# Patient Record
Sex: Male | Born: 1960
Health system: Southern US, Community
[De-identification: ages and names within clinical notes are randomized; demographics above are authoritative.]

## PROBLEM LIST (undated history)

## (undated) DIAGNOSIS — K219 Gastro-esophageal reflux disease without esophagitis: Secondary | ICD-10-CM

## (undated) HISTORY — PX: WISDOM TOOTH EXTRACTION: SHX21

## (undated) HISTORY — PX: TYMPANOSTOMY TUBE PLACEMENT: SHX32

## (undated) HISTORY — PX: TONSILLECTOMY: SUR1361

---

## 2018-05-17 DIAGNOSIS — Z0000 Encounter for general adult medical examination without abnormal findings: Secondary | ICD-10-CM | POA: Diagnosis not present

## 2018-05-21 DIAGNOSIS — Z0000 Encounter for general adult medical examination without abnormal findings: Secondary | ICD-10-CM | POA: Diagnosis not present

## 2018-06-21 DIAGNOSIS — D72820 Lymphocytosis (symptomatic): Secondary | ICD-10-CM | POA: Diagnosis not present

## 2018-07-08 DIAGNOSIS — D72820 Lymphocytosis (symptomatic): Secondary | ICD-10-CM | POA: Diagnosis not present

## 2018-07-08 DIAGNOSIS — R7989 Other specified abnormal findings of blood chemistry: Secondary | ICD-10-CM | POA: Diagnosis not present

## 2018-08-23 DIAGNOSIS — M25569 Pain in unspecified knee: Secondary | ICD-10-CM | POA: Diagnosis not present

## 2019-01-19 ENCOUNTER — Emergency Department (HOSPITAL_COMMUNITY)
Admission: EM | Admit: 2019-01-19 | Discharge: 2019-01-19 | Disposition: A | Discharge status: Home / Self Care | Payer: 59 | Attending: Emergency Medicine | Admitting: Emergency Medicine

## 2019-01-19 ENCOUNTER — Encounter (HOSPITAL_COMMUNITY): Payer: Self-pay | Admitting: Emergency Medicine

## 2019-01-19 ENCOUNTER — Emergency Department (HOSPITAL_COMMUNITY): Payer: 59

## 2019-01-19 ENCOUNTER — Other Ambulatory Visit: Payer: Self-pay

## 2019-01-19 DIAGNOSIS — K8020 Calculus of gallbladder without cholecystitis without obstruction: Secondary | ICD-10-CM | POA: Diagnosis not present

## 2019-01-19 DIAGNOSIS — R7989 Other specified abnormal findings of blood chemistry: Secondary | ICD-10-CM

## 2019-01-19 DIAGNOSIS — R112 Nausea with vomiting, unspecified: Secondary | ICD-10-CM | POA: Insufficient documentation

## 2019-01-19 DIAGNOSIS — R1011 Right upper quadrant pain: Secondary | ICD-10-CM | POA: Insufficient documentation

## 2019-01-19 DIAGNOSIS — F17210 Nicotine dependence, cigarettes, uncomplicated: Secondary | ICD-10-CM | POA: Diagnosis not present

## 2019-01-19 DIAGNOSIS — R61 Generalized hyperhidrosis: Secondary | ICD-10-CM | POA: Insufficient documentation

## 2019-01-19 DIAGNOSIS — R1013 Epigastric pain: Secondary | ICD-10-CM | POA: Diagnosis present

## 2019-01-19 LAB — COMPREHENSIVE METABOLIC PANEL
ALT: 47 U/L — ABNORMAL HIGH (ref 0–44)
AST: 30 U/L (ref 15–41)
Albumin: 4.2 g/dL (ref 3.5–5.0)
Alkaline Phosphatase: 60 U/L (ref 38–126)
Anion gap: 11 (ref 5–15)
BUN: 14 mg/dL (ref 6–20)
CO2: 22 mmol/L (ref 22–32)
Calcium: 9.2 mg/dL (ref 8.9–10.3)
Chloride: 100 mmol/L (ref 98–111)
Creatinine, Ser: 1.44 mg/dL — ABNORMAL HIGH (ref 0.61–1.24)
GFR calc Af Amer: >60 mL/min (ref >60)
GFR calc non Af Amer: 54 mL/min — ABNORMAL LOW (ref >60)
Glucose, Bld: 132 mg/dL — ABNORMAL HIGH (ref 70–99)
Potassium: 3.6 mmol/L (ref 3.5–5.1)
Sodium: 133 mmol/L — ABNORMAL LOW (ref 135–145)
Total Bilirubin: 0.6 mg/dL (ref 0.3–1.2)
Total Protein: 6.9 g/dL (ref 6.5–8.1)

## 2019-01-19 LAB — CBC WITH DIFFERENTIAL/PLATELET
Abs Immature Granulocytes: 0.03 10*3/uL (ref 0.00–0.07)
Basophils Absolute: 0.1 10*3/uL (ref 0.0–0.1)
Basophils Relative: 1 %
Eosinophils Absolute: 0.2 10*3/uL (ref 0.0–0.5)
Eosinophils Relative: 2 %
HCT: 47.4 % (ref 39.0–52.0)
Hemoglobin: 16.8 g/dL (ref 13.0–17.0)
Immature Granulocytes: 0 %
Lymphocytes Relative: 23 %
Lymphs Abs: 2.7 10*3/uL (ref 0.7–4.0)
MCH: 32.4 pg (ref 26.0–34.0)
MCHC: 35.4 g/dL (ref 30.0–36.0)
MCV: 91.5 fL (ref 80.0–100.0)
Monocytes Absolute: 0.7 10*3/uL (ref 0.1–1.0)
Monocytes Relative: 6 %
Neutro Abs: 8 10*3/uL — ABNORMAL HIGH (ref 1.7–7.7)
Neutrophils Relative %: 68 %
Platelets: 228 10*3/uL (ref 150–400)
RBC: 5.18 MIL/uL (ref 4.22–5.81)
RDW: 12.6 % (ref 11.5–15.5)
WBC: 11.7 10*3/uL — ABNORMAL HIGH (ref 4.0–10.5)
nRBC: 0 % (ref 0.0–0.2)

## 2019-01-19 LAB — TROPONIN I: Troponin I: <0.03 ng/mL (ref <0.03)

## 2019-01-19 LAB — LIPASE, BLOOD: Lipase: 47 U/L (ref 11–51)

## 2019-01-19 MED ORDER — FAMOTIDINE IN NACL 20-0.9 MG/50ML-% IV SOLN
20.0000 mg | Freq: Once | INTRAVENOUS | Status: AC
  Administered 2019-01-19: 20 mg via INTRAVENOUS
  Filled 2019-01-19: qty 50

## 2019-01-19 MED ORDER — MORPHINE SULFATE (PF) 4 MG/ML IV SOLN
4.0000 mg | Freq: Once | INTRAVENOUS | Status: AC
  Administered 2019-01-19: 19:00:00 4 mg via INTRAVENOUS
  Filled 2019-01-19: qty 1

## 2019-01-19 MED ORDER — FAMOTIDINE 20 MG PO TABS: 20.0000 mg | ORAL_TABLET | Freq: Two times a day (BID) | ORAL | 0 refills | Status: AC

## 2019-01-19 MED ORDER — HYDROCODONE-ACETAMINOPHEN 5-325 MG PO TABS: 1.0000 | ORAL_TABLET | ORAL | 0 refills | Status: AC | PRN

## 2019-01-19 MED ORDER — OXYCODONE-ACETAMINOPHEN 5-325 MG PO TABS
2.0000 | ORAL_TABLET | Freq: Once | ORAL | Status: AC
  Administered 2019-01-19: 2 via ORAL
  Filled 2019-01-19: qty 2

## 2019-01-19 MED ORDER — ONDANSETRON HCL 4 MG/2ML IJ SOLN
4.0000 mg | Freq: Once | INTRAMUSCULAR | Status: AC
  Administered 2019-01-19: 19:00:00 4 mg via INTRAVENOUS
  Filled 2019-01-19: qty 2

## 2019-01-19 NOTE — ED Provider Notes (Signed)
[facility] Provider Note   CSN: [id number] Arrival date & time: 01/19/19  1836    History   Chief Complaint Chief Complaint  Patient presents with  . Abdominal Pain    HPI Carlos RobinsonDavid Cullop is a 58 y.o. male.     Patient is a 58 year old male who presents with epigastric pain.  He states that started around 3-3 30 this afternoon.  He describes as indigestion pain in his epigastrium that radiates around both sides of his upper abdomen and around to his back.  It slightly more on the right side.  He had some nausea and vomiting associated with this.  He got a little diaphoretic.  No shortness of breath.  He has no family history of early heart disease.  No history of hypertension, hyperlipidemia or diabetes.  He does smoke cigarettes.  He denies any known fevers.  His emesis was nonbloody and nonbilious.     History reviewed. No pertinent past medical history.  There are no active problems to display for this patient.   Past Surgical History:  Procedure Laterality Date  . TONSILLECTOMY          Home Medications    Prior to Admission medications   Medication Sig Start Date End Date Taking? Authorizing Provider  famotidine (PEPCID) 20 MG tablet Take 1 tablet (20 mg total) by mouth 2 (two) times daily. 01/19/19   Rolan BuccoBelfi, Cicily Bonano, MD  HYDROcodone-acetaminophen (NORCO/VICODIN) 5-325 MG tablet Take 1-2 tablets by mouth every 4 (four) hours as needed. 01/19/19   Rolan BuccoBelfi, Daniele Yankowski, MD    Family History No family history on file.  Social History Social History   Tobacco Use  . Smoking status: Current Every Day Smoker    Packs/day: 1.00    Types: Cigarettes  . Smokeless tobacco: Never Used  Substance Use Topics  . Alcohol use: Not Currently  . Drug use: Never     Allergies   Patient has no known allergies.   Review of Systems Review of Systems  Constitutional: Negative for chills, diaphoresis, fatigue and fever.  HENT: Negative  for congestion, rhinorrhea and sneezing.   Eyes: Negative.   Respiratory: Negative for cough, chest tightness and shortness of breath.   Cardiovascular: Negative for chest pain and leg swelling.  Gastrointestinal: Positive for abdominal pain, nausea and vomiting. Negative for blood in stool and diarrhea.  Genitourinary: Negative for difficulty urinating, flank pain, frequency and hematuria.  Musculoskeletal: Positive for back pain. Negative for arthralgias.  Skin: Negative for rash.  Neurological: Negative for dizziness, speech difficulty, weakness, numbness and headaches.     Physical Exam Updated Vital Signs BP (!) 154/100   Pulse 70   Temp 97.8 F (36.6 C) (Oral)   Resp 14   SpO2 99%   Physical Exam Constitutional:      Appearance: He is well-developed.  HENT:     Head: Normocephalic and atraumatic.  Eyes:     Pupils: Pupils are equal, round, and reactive to light.  Neck:     Musculoskeletal: Normal range of motion and neck supple.  Cardiovascular:     Rate and Rhythm: Normal rate and regular rhythm.     Heart sounds: Normal heart sounds.  Pulmonary:     Effort: Pulmonary effort is normal. No respiratory distress.     Breath sounds: Normal breath sounds. No wheezing or rales.  Chest:     Chest wall: No tenderness.  Abdominal:     General: Bowel sounds are normal.  Palpations: Abdomen is soft.     Tenderness: There is abdominal tenderness in the right upper quadrant. There is no guarding or rebound.  Musculoskeletal: Normal range of motion.  Lymphadenopathy:     Cervical: No cervical adenopathy.  Skin:    General: Skin is warm and dry.     Findings: No rash.  Neurological:     Mental Status: He is alert and oriented to person, place, and time.      ED Treatments / Results  Labs (all labs ordered are listed, but only abnormal results are displayed) Labs Reviewed  COMPREHENSIVE METABOLIC PANEL - Abnormal; Notable for the following components:      Result  Value   Sodium 133 (*)    Glucose, Bld 132 (*)    Creatinine, Ser 1.44 (*)    ALT 47 (*)    GFR calc non Af Amer 54 (*)    All other components within normal limits  CBC WITH DIFFERENTIAL/PLATELET - Abnormal; Notable for the following components:   WBC 11.7 (*)    Neutro Abs 8.0 (*)    All other components within normal limits  LIPASE, BLOOD  TROPONIN I    EKG EKG Interpretation  Date/Time:  Saturday Jan 19 2019 18:44:10 EDT Ventricular Rate:  69 PR Interval:    QRS Duration: 107 QT Interval:  382 QTC Calculation: 410 R Axis:   44 Text Interpretation:  Sinus rhythm Borderline prolonged PR interval Low voltage, precordial leads RSR' in V1 or V2, right VCD or RVH No old tracing to compare Confirmed by Rolan Bucco [phone]) on 01/19/2019 6:46:17 PM   Radiology US Abdomen Limited Ruq  Result Date: 01/19/2019 CLINICAL DATA:  Right upper quadrant abdominal pain EXAM: ULTRASOUND ABDOMEN LIMITED RIGHT UPPER QUADRANT COMPARISON:  None. FINDINGS: Gallbladder: Multiple gallstones are noted without gallbladder wall thickening or pericholecystic free fluid. The sonographic Eulah Pont sign is reported as negative. The gallbladder is moderately distended. Common bile duct: Diameter: 0.3 cm Liver: No focal lesion identified. Within normal limits in parenchymal echogenicity. Portal vein is patent on color Doppler imaging with normal direction of blood flow towards the liver. IMPRESSION: Cholelithiasis without sonographic evidence for acute cholecystitis. The gallbladder is somewhat distended. Electronically Signed   By: Katherine Mantle M.D.   On: 01/19/2019 19:49    Procedures Procedures (including critical care time)  Medications Ordered in ED Medications  oxyCODONE-acetaminophen (PERCOCET/ROXICET) 5-325 MG per tablet 2 tablet (has no administration in time range)  morphine 4 MG/ML injection 4 mg (4 mg Intravenous Given 01/19/19 1914)  ondansetron (ZOFRAN) injection 4 mg (4 mg Intravenous  Given 01/19/19 1913)  famotidine (PEPCID) IVPB 20 mg premix (0 mg Intravenous Stopped 01/19/19 2029)     Initial Impression / Assessment and Plan / ED Course  I have reviewed the triage vital signs and the nursing notes.  Pertinent labs & imaging results that were available during my care of the patient were reviewed by me and considered in my medical decision making (see chart for details).        Patient is a 58 year old male who presents with epigastric and right upper quadrant pain.  Ultrasound shows cholelithiasis but no evidence of cholecystitis.  His labs show a mildly elevated white count which he states he has had several times in the past.  He is afebrile.  His creatinine is mildly elevated but the last values we have are from 2012.  His ALT is minimally elevated but his other LFTs are within normal limits.  His pain is controlled in the ED.  His troponin is negative.  He has no EKG changes.  His symptoms sound more consistent with cholelithiasis and not a suspicious for ACS.  He was discharged home in good condition.  He was advised on a low-fat diet.  He was advised to make a follow-up appointment with [facility].  He was advised that he needs to follow-up with his primary care physician regarding his elevated creatinine.  He also was notified that his glucose was mildly elevated.  He was given precautions to return if he has worsening symptoms including worsening abdominal pain, fevers or vomiting.  He was given a small prescription for Vicodin and Pepcid.  Final Clinical Impressions(s) / ED Diagnoses   Final diagnoses:  RUQ pain  Calculus of gallbladder without cholecystitis without obstruction  Elevated serum creatinine    ED Discharge Orders         Ordered    HYDROcodone-acetaminophen (NORCO/VICODIN) 5-325 MG tablet  Every 4 hours PRN     01/19/19 2053    famotidine (PEPCID) 20 MG tablet  2 times daily     01/19/19 2053           Rolan Bucco, MD  01/19/19 2059

## 2019-01-19 NOTE — ED Notes (Signed)
ED Provider at bedside. 

## 2019-01-19 NOTE — ED Notes (Signed)
Wife, called and asked to be informed of any changes with the pt Dolson, carolyn Spouse   [phone]

## 2019-01-19 NOTE — ED Notes (Signed)
Patient educated about not driving or performing other critical tasks (such as operating heavy machinery, caring for infant/toddler/child) due to sedative nature of narcotic medications received while in the ED.  Pt/caregiver verbalized understanding.   

## 2019-01-19 NOTE — Discharge Instructions (Addendum)
Your creatinine was mildly elevated.  This needs to be followed up by your primary care doctor.  You have a gallstone in your gallbladder.  At this point there is no signs of infection of the gallbladder.  You need to make an appointment to follow-up with the surgeon regarding this.  Return to the emergency room if you have any worsening pain, vomiting, fevers or other worsening symptoms.

## 2019-01-19 NOTE — ED Notes (Signed)
Pt updating wife with phone in room.

## 2019-01-19 NOTE — ED Triage Notes (Addendum)
Patient arrives POV c/o epigastric pain onst of 1530 today. Patient states pain started mid upper abdomen but has now radiated around his ribs into his back. Patient also having nausea. Denies any shortness of breath.

## 2019-01-23 ENCOUNTER — Other Ambulatory Visit: Payer: Self-pay | Admitting: General Surgery

## 2019-01-23 DIAGNOSIS — K8020 Calculus of gallbladder without cholecystitis without obstruction: Secondary | ICD-10-CM | POA: Diagnosis not present

## 2019-01-23 DIAGNOSIS — F17200 Nicotine dependence, unspecified, uncomplicated: Secondary | ICD-10-CM | POA: Diagnosis not present

## 2019-01-25 ENCOUNTER — Other Ambulatory Visit: Payer: Self-pay

## 2019-01-25 ENCOUNTER — Encounter (HOSPITAL_COMMUNITY): Payer: Self-pay | Admitting: *Deleted

## 2019-01-25 ENCOUNTER — Other Ambulatory Visit (HOSPITAL_COMMUNITY)
Admission: RE | Admit: 2019-01-25 | Discharge: 2019-01-25 | Disposition: A | Discharge status: Home / Self Care | Payer: 59 | Source: Ambulatory Visit | Attending: General Surgery | Admitting: General Surgery

## 2019-01-25 DIAGNOSIS — Z1159 Encounter for screening for other viral diseases: Secondary | ICD-10-CM | POA: Insufficient documentation

## 2019-01-25 NOTE — Progress Notes (Signed)
Carlos Soto denies chest pain or shortness. Carlos Soto denies that she or her family has experienced any of the following: Cough Fever >100.4 Runny Nose Sore Throat Difficulty breathing/ shortness of breath Travel in past 14 days - no Carlos Soto had COVID test today and he is quarantine with his wife.

## 2019-01-26 LAB — NOVEL CORONAVIRUS, NAA (HOSP ORDER, SEND-OUT TO REF LAB; TAT 18-24 HRS): SARS-CoV-2, NAA: NOT DETECTED

## 2019-01-27 NOTE — H&P (Signed)
Carlos Soto Location: [facility] Surgery Patient #: [id number] DOB: December 01, 1960 Married / Language: English / Race: White Male       History of Present Illness       This is a very pleasant 58 year old man, referred by Rolan Bucco, ED physician for evaluation of symptomatic gallstones. his primary care physician is in [place] and he cannot remember the name.     He may have had some mild nonspecific GI symptoms over the past year. A little reflux and indigestion. A little bloating but nothing dramatic.    on Jan 19, 2019 he developed epigastric pain as he was coming home from work. After about 2 hours it had become more severe right upper quadrant greater than  left upper quadrant and back and was associated with nausea and vomiting. He went to the emergency department and received narcotic analgesics and that helped. Ultrasound showed numerous gallstones but no inflammatory changes. CBD 0.3 cm. ALT 47. Creatinine 1.44. Lipase normal. WBC 11,700. Hemoglobin 6.8. The ED physician said he was tender in the right upper quadrant. has felt better since then. Maybe a little discomfort. No change in his bowel habits. Tends to be a little constipated.      Past history significant for daily cigarette use. Tonsillectomy. Sebaceous cyst removal. Essentially healthy Family history mother living with hypertension. Father living hypertension and peripheral arterial disease and cholecystectomy next social history married. Lives in [place]. 2 children. Smokes tobacco daily. Denies alcohol. 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      He was in no acute distress today but is concerned about recurrent attacks. I offered to proceed with cholecystectomy and he agrees. I advised him to stay well-hydrated and gave him specific instructions because of his creatinine I told him to adhere to a very low-fat diet to try to avoid attacks       He will be scheduled for laparoscopic cholecystectomy with cholangiogram. I discussed the indications, details, techniques, and numerous risk of the surgery with him. He is aware the risk of bleeding, infection, injury to adjacent organs with major reconstructive surgery, conversion to open laparotomy, pancreatitis, bile leak, cardiac pulmonary thromboembolic problems. He understands these issues well. All his questions were answered. He agrees with this plan.      Past Surgical History  Tonsillectomy   Diagnostic Studies History  Colonoscopy  never  Allergies  No Known Drug Allergies  Allergies Reconciled   Medication History Famotidine (20MG  Tablet, Oral) Active. HYDROcodone-Acetaminophen (5-325MG  Tablet, Oral) Active. Tylenol (325MG  Tablet, Oral) Active. Sudafed 12 Hour (Oral) Specific strength unknown - Active. Medications Reconciled  Social History Alcohol use  Remotely quit alcohol use. Caffeine use  Carbonated beverages, Coffee, Tea. No drug use  Tobacco use  Current every day smoker.  Family History  Alcohol Abuse  Father. Diabetes Mellitus  Mother. Hypertension  Father, Mother. Migraine Headache  Son.  Other Problems  Back Pain     Review of Systems  General Present- Appetite Loss. Not Present- Chills, Fatigue, Fever, Night Sweats, Weight Gain and Weight Loss. Skin Not Present- Change in Wart/Mole, Dryness, Hives, Jaundice, New Lesions, Non-Healing Wounds, Rash and Ulcer. HEENT Present- Wears glasses/contact lenses. Not Present- Earache, Hearing Loss, Hoarseness, Nose Bleed, Oral Ulcers, Ringing in the Ears, Seasonal Allergies, Sinus Pain, Sore Throat, Visual Disturbances and Yellow Eyes. Respiratory Not Present- Bloody sputum, Chronic Cough, Difficulty Breathing, Snoring and Wheezing. Cardiovascular Not Present- Chest Pain,  Difficulty Breathing Lying Down, Leg Cramps, Palpitations, Rapid Heart Rate, Shortness of Breath and Swelling of  Extremities. Gastrointestinal Present- Bloating and Gets full quickly at meals. Not Present- Abdominal Pain, Bloody Stool, Change in Bowel Habits, Chronic diarrhea, Constipation, Difficulty Swallowing, Excessive gas, Hemorrhoids, Indigestion, Nausea, Rectal Pain and Vomiting. Male Genitourinary Present- Frequency and Urine Leakage. Not Present- Blood in Urine, Change in Urinary Stream, Impotence, Nocturia, Painful Urination and Urgency. Musculoskeletal Not Present- Back Pain, Joint Pain, Joint Stiffness, Muscle Pain, Muscle Weakness and Swelling of Extremities. Neurological Not Present- Decreased Memory, Fainting, Headaches, Numbness, Seizures, Tingling, Tremor, Trouble walking and Weakness. Psychiatric Not Present- Anxiety, Bipolar, Change in Sleep Pattern, Depression, Fearful and Frequent crying. Endocrine Not Present- Cold Intolerance, Excessive Hunger, Hair Changes, Heat Intolerance, Hot flashes and New Diabetes. Hematology Not Present- Blood Thinners, Easy Bruising, Excessive bleeding, Gland problems, HIV and Persistent Infections.  Vitals  Weight: 167.2 lb Height: 65in Body Surface Area: 1.83 m Body Mass Index: 27.82 kg/m  Temp.: 56F(Temporal)  Pulse: 89 (Regular)  P.OX: 98% (Room air) BP: 128/88 (Sitting, Left Arm, Standard)    Physical Exam  General Mental Status-Alert. General Appearance-Consistent with stated age. Hydration-Well hydrated. Voice-Normal.  Head and Neck Head-normocephalic, atraumatic with no lesions or palpable masses. Trachea-midline.  Eye Eyeball - Bilateral-Extraocular movements intact. Sclera/Conjunctiva - Bilateral-No scleral icterus.  Chest and Lung Exam Chest and lung exam reveals -quiet, even and easy respiratory effort with no use of accessory muscles and on auscultation, normal breath sounds, no adventitious sounds and normal vocal resonance. Inspection Chest Wall - Normal. Back -  normal.  Cardiovascular Cardiovascular examination reveals -normal heart sounds, regular rate and rhythm with no murmurs and normal pedal pulses bilaterally.  Abdomen Inspection Inspection of the abdomen reveals - No Hernias. Skin - Scar - no surgical scars. Palpation/Percussion Palpation and Percussion of the abdomen reveal - Soft, Non Tender, No Rebound tenderness, No Rigidity (guarding) and No hepatosplenomegaly. Auscultation Auscultation of the abdomen reveals - Bowel sounds normal. Note: Abdomen is soft. Nondistended. Nontender. No palpable mass of the right upper quadrant.   Neurologic Neurologic evaluation reveals -alert and oriented x 3 with no impairment of recent or remote memory. Mental Status-Normal.  Musculoskeletal Normal Exam - Left-Upper Extremity Strength Normal and Lower Extremity Strength Normal. Normal Exam - Right-Upper Extremity Strength Normal, Lower Extremity Weakness.    Assessment & Plan  GALLSTONES (K80.20)  You were recently evaluated in the emergency department for moderately severe upper abdominal pain and vomiting. your gallbladder ultrasound showed multiple gallstones One of your liver function tests were slightly elevated One of your kidney test was slightly elevated, possibly due to dehydration  Things have settled down and you are not having much pain now I cannot predict when you will have another attack, nor can I predict when you'll have a complication I have offered to schedule you for gallbladder surgery and you have agreed to this  In the meantime, I recommend that you drink 6 or 7 glasses of water or juice a day to rehydrate In the meantime, I recommend a very strict low-fat diet  you will be scheduled for laparoscopic cholecystectomy with possible cholangiogram in the near future I discussed the indications, techniques, and risk of the surgery with you in detail  SMOKES TOBACCO DAILY (F17.200) Impression: I encouraged  smoking cessation    Carlos Soto M. Derrell LollingIngram, M.D., [facility] WorthFACS [facility] General and Minimally invasive Surgery Breast and Colorectal Surgery Office:   [phone] Pager:   [phone]

## 2019-01-28 ENCOUNTER — Ambulatory Visit (HOSPITAL_COMMUNITY)
Admission: RE | Admit: 2019-01-28 | Discharge: 2019-01-28 | Disposition: A | Discharge status: Home / Self Care | Payer: 59 | Attending: General Surgery | Admitting: General Surgery

## 2019-01-28 ENCOUNTER — Other Ambulatory Visit: Payer: Self-pay

## 2019-01-28 ENCOUNTER — Encounter (HOSPITAL_COMMUNITY): Payer: Self-pay | Admitting: *Deleted

## 2019-01-28 ENCOUNTER — Encounter (HOSPITAL_COMMUNITY)
Admission: RE | Disposition: A | Discharge status: Home / Self Care | Payer: Self-pay | Source: Home / Self Care | Attending: General Surgery

## 2019-01-28 ENCOUNTER — Ambulatory Visit (HOSPITAL_COMMUNITY): Payer: 59 | Admitting: Certified Registered Nurse Anesthetist

## 2019-01-28 DIAGNOSIS — Z79899 Other long term (current) drug therapy: Secondary | ICD-10-CM | POA: Diagnosis not present

## 2019-01-28 DIAGNOSIS — K219 Gastro-esophageal reflux disease without esophagitis: Secondary | ICD-10-CM | POA: Diagnosis not present

## 2019-01-28 DIAGNOSIS — F17210 Nicotine dependence, cigarettes, uncomplicated: Secondary | ICD-10-CM | POA: Insufficient documentation

## 2019-01-28 DIAGNOSIS — K5900 Constipation, unspecified: Secondary | ICD-10-CM | POA: Insufficient documentation

## 2019-01-28 DIAGNOSIS — K8012 Calculus of gallbladder with acute and chronic cholecystitis without obstruction: Secondary | ICD-10-CM | POA: Insufficient documentation

## 2019-01-28 HISTORY — PX: CHOLECYSTECTOMY: SHX55

## 2019-01-28 HISTORY — DX: Gastro-esophageal reflux disease without esophagitis: K21.9

## 2019-01-28 LAB — COMPREHENSIVE METABOLIC PANEL
ALT: 44 U/L (ref 0–44)
AST: 42 U/L — ABNORMAL HIGH (ref 15–41)
Albumin: 3.5 g/dL (ref 3.5–5.0)
Alkaline Phosphatase: 66 U/L (ref 38–126)
Anion gap: 11 (ref 5–15)
BUN: 17 mg/dL (ref 6–20)
CO2: 19 mmol/L — ABNORMAL LOW (ref 22–32)
Calcium: 8.9 mg/dL (ref 8.9–10.3)
Chloride: 106 mmol/L (ref 98–111)
Creatinine, Ser: 1.17 mg/dL (ref 0.61–1.24)
GFR calc Af Amer: >60 mL/min (ref >60)
GFR calc non Af Amer: >60 mL/min (ref >60)
Glucose, Bld: 89 mg/dL (ref 70–99)
Potassium: 5 mmol/L (ref 3.5–5.1)
Sodium: 136 mmol/L (ref 135–145)
Total Bilirubin: 1.5 mg/dL — ABNORMAL HIGH (ref 0.3–1.2)
Total Protein: 6.6 g/dL (ref 6.5–8.1)

## 2019-01-28 SURGERY — LAPAROSCOPIC CHOLECYSTECTOMY WITH INTRAOPERATIVE CHOLANGIOGRAM
Anesthesia: General | Site: Abdomen

## 2019-01-28 MED ORDER — SODIUM CHLORIDE 0.9% FLUSH: 3.0000 mL | INTRAVENOUS | Status: DC | PRN

## 2019-01-28 MED ORDER — FENTANYL CITRATE (PF) 250 MCG/5ML IJ SOLN
INTRAMUSCULAR | Status: DC | PRN
  Administered 2019-01-28: 100 ug via INTRAVENOUS
  Administered 2019-01-28 (×3): 50 ug via INTRAVENOUS

## 2019-01-28 MED ORDER — PROPOFOL 10 MG/ML IV BOLUS
INTRAVENOUS | Status: DC | PRN
  Administered 2019-01-28: 150 mg via INTRAVENOUS

## 2019-01-28 MED ORDER — BUPIVACAINE-EPINEPHRINE (PF) 0.25% -1:200000 IJ SOLN
INTRAMUSCULAR | Status: DC | PRN
  Administered 2019-01-28: 14 mL

## 2019-01-28 MED ORDER — FENTANYL CITRATE (PF) 100 MCG/2ML IJ SOLN
INTRAMUSCULAR | Status: AC
  Administered 2019-01-28: 13:00:00 50 ug via INTRAVENOUS
  Filled 2019-01-28: qty 2

## 2019-01-28 MED ORDER — ACETAMINOPHEN 500 MG PO TABS: 1000.0000 mg | ORAL_TABLET | Freq: Four times a day (QID) | ORAL | Status: DC

## 2019-01-28 MED ORDER — ACETAMINOPHEN 500 MG PO TABS
1000.0000 mg | ORAL_TABLET | ORAL | Status: AC
  Administered 2019-01-28: 1000 mg via ORAL
  Filled 2019-01-28: qty 2

## 2019-01-28 MED ORDER — SUGAMMADEX SODIUM 200 MG/2ML IV SOLN
INTRAVENOUS | Status: DC | PRN
  Administered 2019-01-28: 140 mg via INTRAVENOUS

## 2019-01-28 MED ORDER — CHLORHEXIDINE GLUCONATE CLOTH 2 % EX PADS: 6.0000 | MEDICATED_PAD | Freq: Once | CUTANEOUS | Status: DC

## 2019-01-28 MED ORDER — TRAMADOL HCL 50 MG PO TABS: 50.0000 mg | ORAL_TABLET | Freq: Four times a day (QID) | ORAL | 0 refills | Status: AC | PRN

## 2019-01-28 MED ORDER — ONDANSETRON HCL 4 MG/2ML IJ SOLN
INTRAMUSCULAR | Status: AC
  Filled 2019-01-28: qty 2

## 2019-01-28 MED ORDER — MIDAZOLAM HCL 2 MG/2ML IJ SOLN
INTRAMUSCULAR | Status: AC
  Filled 2019-01-28: qty 2

## 2019-01-28 MED ORDER — DEXAMETHASONE SODIUM PHOSPHATE 10 MG/ML IJ SOLN
INTRAMUSCULAR | Status: AC
  Filled 2019-01-28: qty 1

## 2019-01-28 MED ORDER — SODIUM CHLORIDE 0.9 % IV SOLN
INTRAVENOUS | Status: DC | PRN
  Administered 2019-01-28: 100 mL

## 2019-01-28 MED ORDER — LIDOCAINE 2% (20 MG/ML) 5 ML SYRINGE
INTRAMUSCULAR | Status: AC
  Filled 2019-01-28: qty 5

## 2019-01-28 MED ORDER — OXYCODONE HCL 5 MG PO TABS
ORAL_TABLET | ORAL | Status: AC
  Filled 2019-01-28: qty 1

## 2019-01-28 MED ORDER — LACTATED RINGERS IV SOLN
INTRAVENOUS | Status: DC
  Administered 2019-01-28: 09:00:00 via INTRAVENOUS

## 2019-01-28 MED ORDER — SODIUM CHLORIDE 0.9% FLUSH: 3.0000 mL | Freq: Two times a day (BID) | INTRAVENOUS | Status: DC

## 2019-01-28 MED ORDER — FENTANYL CITRATE (PF) 100 MCG/2ML IJ SOLN
25.0000 ug | INTRAMUSCULAR | Status: DC | PRN
  Administered 2019-01-28 (×2): 50 ug via INTRAVENOUS

## 2019-01-28 MED ORDER — CELECOXIB 200 MG PO CAPS
200.0000 mg | ORAL_CAPSULE | ORAL | Status: AC
  Administered 2019-01-28: 09:00:00 200 mg via ORAL
  Filled 2019-01-28: qty 1

## 2019-01-28 MED ORDER — BUPIVACAINE-EPINEPHRINE (PF) 0.25% -1:200000 IJ SOLN
INTRAMUSCULAR | Status: AC
  Filled 2019-01-28: qty 30

## 2019-01-28 MED ORDER — LIDOCAINE 2% (20 MG/ML) 5 ML SYRINGE
INTRAMUSCULAR | Status: DC | PRN
  Administered 2019-01-28: 60 mg via INTRAVENOUS

## 2019-01-28 MED ORDER — ROCURONIUM BROMIDE 10 MG/ML (PF) SYRINGE
PREFILLED_SYRINGE | INTRAVENOUS | Status: DC | PRN
  Administered 2019-01-28: 60 mg via INTRAVENOUS

## 2019-01-28 MED ORDER — 0.9 % SODIUM CHLORIDE (POUR BTL) OPTIME
TOPICAL | Status: DC | PRN
  Administered 2019-01-28: 1000 mL

## 2019-01-28 MED ORDER — SODIUM CHLORIDE 0.9 % IV SOLN: 250.0000 mL | INTRAVENOUS | Status: DC | PRN

## 2019-01-28 MED ORDER — ESMOLOL HCL 100 MG/10ML IV SOLN
INTRAVENOUS | Status: DC | PRN
  Administered 2019-01-28: 20 mg via INTRAVENOUS
  Administered 2019-01-28: 30 mg via INTRAVENOUS

## 2019-01-28 MED ORDER — GABAPENTIN 300 MG PO CAPS
300.0000 mg | ORAL_CAPSULE | ORAL | Status: AC
  Administered 2019-01-28: 300 mg via ORAL
  Filled 2019-01-28: qty 1

## 2019-01-28 MED ORDER — CEFAZOLIN SODIUM-DEXTROSE 2-4 GM/100ML-% IV SOLN
2.0000 g | INTRAVENOUS | Status: AC
  Administered 2019-01-28: 2 g via INTRAVENOUS
  Filled 2019-01-28: qty 100

## 2019-01-28 MED ORDER — ACETAMINOPHEN 325 MG PO TABS: 650.0000 mg | ORAL_TABLET | ORAL | Status: DC | PRN

## 2019-01-28 MED ORDER — FENTANYL CITRATE (PF) 250 MCG/5ML IJ SOLN
INTRAMUSCULAR | Status: AC
  Filled 2019-01-28: qty 5

## 2019-01-28 MED ORDER — MIDAZOLAM HCL 2 MG/2ML IJ SOLN
INTRAMUSCULAR | Status: DC | PRN
  Administered 2019-01-28: 2 mg via INTRAVENOUS

## 2019-01-28 MED ORDER — OXYCODONE HCL 5 MG PO TABS
5.0000 mg | ORAL_TABLET | Freq: Once | ORAL | Status: AC
  Administered 2019-01-28: 5 mg via ORAL

## 2019-01-28 MED ORDER — ACETAMINOPHEN 650 MG RE SUPP: 650.0000 mg | RECTAL | Status: DC | PRN

## 2019-01-28 MED ORDER — SUGAMMADEX SODIUM 500 MG/5ML IV SOLN
INTRAVENOUS | Status: AC
  Filled 2019-01-28: qty 5

## 2019-01-28 MED ORDER — OXYCODONE HCL 5 MG PO TABS: 5.0000 mg | ORAL_TABLET | ORAL | Status: DC | PRN

## 2019-01-28 MED ORDER — FENTANYL CITRATE (PF) 100 MCG/2ML IJ SOLN: 25.0000 ug | INTRAMUSCULAR | Status: DC | PRN

## 2019-01-28 MED ORDER — SODIUM CHLORIDE 0.9 % IR SOLN
Status: DC | PRN
  Administered 2019-01-28: 1000 mL

## 2019-01-28 MED ORDER — DEXAMETHASONE SODIUM PHOSPHATE 10 MG/ML IJ SOLN
INTRAMUSCULAR | Status: DC | PRN
  Administered 2019-01-28: 10 mg via INTRAVENOUS

## 2019-01-28 MED ORDER — STERILE WATER FOR IRRIGATION IR SOLN
Status: DC | PRN
  Administered 2019-01-28: 1000 mL

## 2019-01-28 MED ORDER — ROCURONIUM BROMIDE 10 MG/ML (PF) SYRINGE
PREFILLED_SYRINGE | INTRAVENOUS | Status: AC
  Filled 2019-01-28: qty 10

## 2019-01-28 MED ORDER — ONDANSETRON HCL 4 MG/2ML IJ SOLN
INTRAMUSCULAR | Status: DC | PRN
  Administered 2019-01-28: 4 mg via INTRAVENOUS

## 2019-01-28 SURGICAL SUPPLY — 44 items
APPLIER CLIP ROT 10 11.4 M/L (STAPLE) ×3
BLADE CLIPPER SURG (BLADE) ×3 IMPLANT
CANISTER SUCT 3000ML PPV (MISCELLANEOUS) ×3 IMPLANT
CHLORAPREP W/TINT 26 (MISCELLANEOUS) ×3 IMPLANT
CLIP APPLIE ROT 10 11.4 M/L (STAPLE) ×1 IMPLANT
COVER MAYO STAND STRL (DRAPES) ×3 IMPLANT
COVER SURGICAL LIGHT HANDLE (MISCELLANEOUS) ×3 IMPLANT
DERMABOND ADVANCED (GAUZE/BANDAGES/DRESSINGS) ×2
DERMABOND ADVANCED .7 DNX12 (GAUZE/BANDAGES/DRESSINGS) ×1 IMPLANT
DRAPE C-ARM 42X72 X-RAY (DRAPES) ×3 IMPLANT
ELECT REM PT RETURN 9FT ADLT (ELECTROSURGICAL) ×3
ELECTRODE REM PT RTRN 9FT ADLT (ELECTROSURGICAL) ×1 IMPLANT
GLOVE BIO SURGEON STRL SZ7 (GLOVE) ×3 IMPLANT
GLOVE BIOGEL PI IND STRL 7.0 (GLOVE) ×1 IMPLANT
GLOVE BIOGEL PI INDICATOR 7.0 (GLOVE) ×2
GLOVE EUDERMIC 7 POWDERFREE (GLOVE) ×3 IMPLANT
GLOVE SURG SS PI 6.5 STRL IVOR (GLOVE) ×3 IMPLANT
GOWN STRL REUS W/ TWL LRG LVL3 (GOWN DISPOSABLE) ×2 IMPLANT
GOWN STRL REUS W/ TWL XL LVL3 (GOWN DISPOSABLE) ×1 IMPLANT
GOWN STRL REUS W/TWL LRG LVL3 (GOWN DISPOSABLE) ×4
GOWN STRL REUS W/TWL XL LVL3 (GOWN DISPOSABLE) ×2
KIT BASIN OR (CUSTOM PROCEDURE TRAY) ×3 IMPLANT
KIT TURNOVER KIT B (KITS) ×3 IMPLANT
NS IRRIG 1000ML POUR BTL (IV SOLUTION) ×3 IMPLANT
PAD ARMBOARD 7.5X6 YLW CONV (MISCELLANEOUS) ×3 IMPLANT
POUCH RETRIEVAL ECOSAC 10 (ENDOMECHANICALS) ×1 IMPLANT
POUCH RETRIEVAL ECOSAC 10MM (ENDOMECHANICALS) ×2
POUCH SPECIMEN RETRIEVAL 10MM (ENDOMECHANICALS) ×3 IMPLANT
SCISSORS LAP 5X35 DISP (ENDOMECHANICALS) ×3 IMPLANT
SET CHOLANGIOGRAPH 5 50 .035 (SET/KITS/TRAYS/PACK) ×3 IMPLANT
SET IRRIG TUBING LAPAROSCOPIC (IRRIGATION / IRRIGATOR) ×3 IMPLANT
SET TUBE SMOKE EVAC HIGH FLOW (TUBING) ×3 IMPLANT
SLEEVE ENDOPATH XCEL 5M (ENDOMECHANICALS) ×3 IMPLANT
SPECIMEN JAR SMALL (MISCELLANEOUS) ×3 IMPLANT
SUT MNCRL AB 4-0 PS2 18 (SUTURE) ×3 IMPLANT
SUT VICRYL 0 UR6 27IN ABS (SUTURE) ×3 IMPLANT
TOWEL GREEN STERILE FF (TOWEL DISPOSABLE) ×3 IMPLANT
TRAY LAPAROSCOPIC MC (CUSTOM PROCEDURE TRAY) ×3 IMPLANT
TROCAR XCEL BLUNT TIP 100MML (ENDOMECHANICALS) ×3 IMPLANT
TROCAR XCEL NON-BLD 11X100MML (ENDOMECHANICALS) ×3 IMPLANT
TROCAR XCEL NON-BLD 5MMX100MML (ENDOMECHANICALS) ×3 IMPLANT
TUBE CONNECTING 12'X1/4 (SUCTIONS) ×1
TUBE CONNECTING 12X1/4 (SUCTIONS) ×2 IMPLANT
WATER STERILE IRR 1000ML POUR (IV SOLUTION) ×3 IMPLANT

## 2019-01-28 NOTE — Anesthesia Preprocedure Evaluation (Addendum)
Anesthesia Evaluation  Patient identified by MRN, date of birth, ID band Patient awake    Reviewed: Allergy & Precautions, NPO status , Patient's Chart, lab work & pertinent test results  Airway Mallampati: I  TM Distance: >3 FB Neck ROM: Full    Dental  (+) Missing, Chipped, Poor Dentition,    Pulmonary neg pulmonary ROS, Current Smoker,    Pulmonary exam normal breath sounds clear to auscultation       Cardiovascular negative cardio ROS Normal cardiovascular exam Rhythm:Regular Rate:Normal     Neuro/Psych negative neurological ROS  negative psych ROS   GI/Hepatic Neg liver ROS, GERD  Medicated,  Endo/Other  negative endocrine ROS  Renal/GU negative Renal ROS  negative genitourinary   Musculoskeletal negative musculoskeletal ROS (+)   Abdominal   Peds negative pediatric ROS (+)  Hematology negative hematology ROS (+)   Anesthesia Other Findings   Reproductive/Obstetrics negative OB ROS                            Anesthesia Physical Anesthesia Plan  ASA: II  Anesthesia Plan: General   Post-op Pain Management:    Induction: Intravenous  PONV Risk Score and Plan: 1 and Ondansetron, Dexamethasone and Midazolam  Airway Management Planned: Oral ETT  Additional Equipment:   Intra-op Plan:   Post-operative Plan: Extubation in OR  Informed Consent: I have reviewed the patients History and Physical, chart, labs and discussed the procedure including the risks, benefits and alternatives for the proposed anesthesia with the patient or authorized representative who has indicated his/her understanding and acceptance.     Dental advisory given  Plan Discussed with: CRNA  Anesthesia Plan Comments:         Anesthesia Quick Evaluation

## 2019-01-28 NOTE — Progress Notes (Signed)
Spoke with Dr. Derrell Lolling regarding needed lab work. He request the CMET be repeated. He advised it is ok to administer tylenol with slight elevation of ALT.

## 2019-01-28 NOTE — Transfer of Care (Signed)
Immediate Anesthesia Transfer of Care Note  Patient: Carlos Soto  Procedure(s) Performed: LAPAROSCOPIC CHOLECYSTECTOMY WITH ATTEMPTED INTRAOPERATIVE CHOLANGIOGRAM (N/A Abdomen)  Patient Location: PACU  Anesthesia Type:General  Level of Consciousness: awake, alert , oriented and patient cooperative  Airway & Oxygen Therapy: Patient Spontanous Breathing  Post-op Assessment: Report given to RN, Post -op Vital signs reviewed and stable and Patient moving all extremities X 4  Post vital signs: Reviewed and stable  Last Vitals:  Vitals Value Taken Time  BP 148/122 01/28/2019 12:08 PM  Temp    Pulse 85 01/28/2019 12:10 PM  Resp 16 01/28/2019 12:10 PM  SpO2 96 % 01/28/2019 12:10 PM  Vitals shown include unvalidated device data.  Last Pain:  Vitals:   01/28/19 0857  TempSrc:   PainSc: 0-No pain      Patients Stated Pain Goal: 4 (01/28/19 0857)  Complications: No apparent anesthesia complications

## 2019-01-28 NOTE — Interval H&P Note (Signed)
History and Physical Interval Note:  01/28/2019 9:54 AM  Carlos Soto  has presented today for surgery, with the diagnosis of gallstones.  The various methods of treatment have been discussed with the patient and family. After consideration of risks, benefits and other options for treatment, the patient has consented to  Procedure(s): LAPAROSCOPIC CHOLECYSTECTOMY WITH INTRAOPERATIVE CHOLANGIOGRAM (N/A) as a surgical intervention.  The patient's history has been reviewed, patient examined, no change in status, stable for surgery.  I have reviewed the patient's chart and labs.  Questions were answered to the patient's satisfaction.     Ernestene Mention

## 2019-01-28 NOTE — Op Note (Signed)
Patient Name:           Carlos Soto   Date of Surgery:        01/28/2019  Pre op Diagnosis:      Chronic cholecystitis with cholelithiasis  Post op Diagnosis:    Acute and chronic cholecystitis with cholelithiasis  Procedure:                 Laparoscopic cholecystectomy  Surgeon:                     Angelia Mould. Derrell Lolling, M.D., FACS  Assistant:                      OR staff  Operative Indications:   This is a very pleasant 57 year old man, referred by Rolan Bucco, ED physician for evaluation of symptomatic gallstones. his primary care physician is in [place].     He may have had some mild nonspecific GI symptoms over the past year. A little reflux and indigestion. A little bloating but nothing dramatic.    on Jan 19, 2019 he developed epigastric pain as he was coming home from work. After about 2 hours it had become more severe right upper quadrant greater than  left upper quadrant and back and was associated with nausea and vomiting. He went to the emergency department and received narcotic analgesics and that helped. Ultrasound showed numerous gallstones but no inflammatory changes. CBD 0.3 cm. ALT 47. Creatinine 1.44. Lipase normal. WBC 11,700. Hemoglobin 6.8. The ED physician said he was tender in the right upper quadrant. has felt better since then. Maybe a little discomfort. No change in his bowel habits. Tends to be a little constipated.      Past history significant for daily cigarette use. Tonsillectomy.       He  is concerned about recurrent attacks. I offered to proceed with cholecystectomy and he agrees. I advised him to stay well-hydrated and gave him specific instructions because of his creatinine I told him to adhere to a very low-fat diet to try to avoid attacks      He will be scheduled for laparoscopic cholecystectomy with cholangiogram. I discussed the indications, details, techniques, and numerous risk of the surgery with him. He is aware the risk of  bleeding, infection, injury to adjacent organs with major reconstructive surgery, conversion to open laparotomy, pancreatitis, bile leak, cardiac pulmonary thromboembolic problems. He understands these issues well. All his questions were answered. He agrees with this plan.  Operative Findings:       The gallbladder was acutely and chronically inflamed.  It looks like he had had recent attacks.  Gallbladder was edematous, thick walled.  There were adhesions to the gallbladder but visualization of the anatomy was fairly straightforward.  The cystic duct was tiny and I tried to cannulate two separate areas of the cystic ductand was unable so I had to abandon the cholangiogram.  We otherwise had a good window and critical view before any dissection or transection occurred.  Four-quadrant inspection before and after the surgery revealed no injury bleeding or other disease.  Procedure in Detail:          Following the induction of general endotracheal anesthesia the patient's abdomen was prepped and draped in a sterile fashion.  Surgical timeout was performed.  Intravenous antibiotics were given.  0.5% Marcaine with epinephrine was used as a local infiltration anesthetic.    A vertical incision was made at the lower rim  of the umbilicus.  The fascia was incised in the midline and the abdominal cavity entered under direct vision.  An 11 mm Hassan trocar was inserted and secured the pursestring suture of 0 Vicryl.  Pneumoperitoneum was created.  Video cane was inserted.  A trocar was placed in the subxiphoid region and 2 trochars in the right upper quadrant.  We elevated the fundus of the gallbladder.  We took down all of the adhesions.  We slowly incised the peritoneum above the neck of the gallbladder and dissected down toward the cystic duct and the cystic artery.  We ultimately created a nice window behind the cystic duct and isolated the cystic artery as well.  I attempted to cannulate the cystic duct in 2  areas.  The lumen was very tiny but I was able to see the lumen but this could not be cannulated.  The cystic duct was then secured with multiple metal clips and divided.  The cystic artery was isolated, secured with multiple metal clips and divided.    The gallbladder was dissected from its bed with electrocautery, placed in a specimen bag and removed.  The gallbladder bed was acutely and chronically inflamed.  I cauterized this until it was completely dry.  I irrigated the subhepatic and subphrenic space extensively until the irrigation fluid was clear.  There was no bleeding or bile leak.  The omentum was packed up into the gallbladder bed.  The pneumoperitoneum was released into the closed suction system.  The fascia at the umbilicus was closed with a pursestring suture of 0 Vicryl and a figure-of-eight suture of 0 Vicryl.  The skin incisions were closed with subcuticular sutures of 4-0 Monocryl and Dermabond.  The patient tolerated the procedure well and was taken to PACU in stable condition.  EBL 25 cc or less.  Counts correct.  Complications none.    Addendum: I logged onto the PMP aware website and reviewed his prescription medication history     Christol Thetford M. Derrell LollingIngram, M.D., FACS General and Minimally Invasive Surgery Breast and Colorectal Surgery  01/28/2019 12:04 PM

## 2019-01-28 NOTE — Anesthesia Procedure Notes (Signed)
Procedure Name: Intubation Date/Time: 01/28/2019 10:54 AM Performed by: Julieta Bellini, CRNA Pre-anesthesia Checklist: Patient identified, Emergency Drugs available, Suction available and Patient being monitored Patient Re-evaluated:Patient Re-evaluated prior to induction Oxygen Delivery Method: Circle system utilized Preoxygenation: Pre-oxygenation with 100% oxygen Induction Type: IV induction Ventilation: Mask ventilation without difficulty Laryngoscope Size: Mac and 4 Grade View: Grade I Tube type: Oral Tube size: 7.5 mm Number of attempts: 1 Airway Equipment and Method: Stylet and Oral airway Placement Confirmation: ETT inserted through vocal cords under direct vision,  positive ETCO2 and breath sounds checked- equal and bilateral Secured at: 22 cm Tube secured with: Tape Dental Injury: Teeth and Oropharynx as per pre-operative assessment

## 2019-01-28 NOTE — Discharge Instructions (Signed)
[facility] ______[facility] °LAPAROSCOPIC SURGERY: POST OP INSTRUCTIONS °Always review your discharge instruction sheet given to you by the facility where your surgery was performed. °IF YOU HAVE DISABILITY OR FAMILY LEAVE FORMS, YOU MUST BRING THEM TO THE OFFICE FOR PROCESSING.   °DO NOT GIVE THEM TO YOUR DOCTOR. ° °1. A prescription for pain medication may be given to you upon discharge.  Take your pain medication as prescribed, if needed.  If narcotic pain medicine is not needed, then you may take acetaminophen (Tylenol) or ibuprofen (Advil) as needed. °2. Take your usually prescribed medications unless otherwise directed. °3. If you need a refill on your pain medication, please contact your pharmacy.  They will contact our office to request authorization. Prescriptions will not be filled after 5pm or on week-ends. °4. You should follow a light diet the first few days after arrival home, such as soup and crackers, etc.  Be sure to include lots of fluids daily. °5. Most patients will experience some swelling and bruising in the area of the incisions.  Ice packs will help.  Swelling and bruising can take several days to resolve.  °6. It is common to experience some constipation if taking pain medication after surgery.  Increasing fluid intake and taking a stool softener (such as Colace) will usually help or prevent this problem from occurring.  A mild laxative (Milk of Magnesia or Miralax) should be taken according to package instructions if there are no bowel movements after 48 hours. °7. Unless discharge instructions indicate otherwise, you may remove your bandages 24-48 hours after surgery, and you may shower at that time.  You may have steri-strips (small skin tapes) in place directly over the incision.  These strips should be left on the skin for 7-10 days.  If your surgeon used skin glue on the incision, you may shower in 24 hours.  The glue will flake off over the next 2-3 weeks.  Any sutures or  staples will be removed at the office during your follow-up visit. °8. ACTIVITIES:  You may resume regular (light) daily activities beginning the next day--such as daily self-care, walking, climbing stairs--gradually increasing activities as tolerated.  You may have sexual intercourse when it is comfortable.  Refrain from any heavy lifting or straining until approved by your doctor. °a. You may drive when you are no longer taking prescription pain medication, you can comfortably wear a seatbelt, and you can safely maneuver your car and apply brakes. °b. RETURN TO WORK:  __________________________________________________________ °9. You should see your doctor in the office for a follow-up appointment approximately 2-3 weeks after your surgery.  Make sure that you call for this appointment within a day or two after you arrive home to insure a convenient appointment time. °10. OTHER INSTRUCTIONS: __________________________________________________________________________________________________________________________ __________________________________________________________________________________________________________________________ °WHEN TO CALL YOUR DOCTOR: °1. Fever over 101.0 °2. Inability to urinate °3. Continued bleeding from incision. °4. Increased pain, redness, or drainage from the incision. °5. Increasing abdominal pain ° °The clinic staff is available to answer your questions during regular business hours.  Please don’t hesitate to call and ask to speak to one of the nurses for clinical concerns.  If you have a medical emergency, go to the nearest emergency room or call 911.  A surgeon from [facility] is always on call at the hospital. °[address], [address], [place], [place]  [address] ? [address] [address], [place], [place]   [address] °[phone] ? [phone] ? FAX [phone] °Web site:   [email or web address] ° °••••••••• ° ° °Managing Your Pain After Surgery Without  Opioids ° ° ° °Thank you for participating in our program to help patients manage their pain after surgery without opioids. This is part of our effort to provide you with the best care possible, without exposing you or your family to the risk that opioids pose. ° °What pain can I expect after surgery? °You can expect to have some pain after surgery. This is normal. The pain is typically worse the day after surgery, and quickly begins to get better. °Many studies have found that many patients are able to manage their pain after surgery with Over-the-Counter (OTC) medications such as Tylenol and Motrin. If you have a condition that does not allow you to take Tylenol or Motrin, notify your surgical team. ° °How will I manage my pain? °The best strategy for controlling your pain after surgery is around the clock pain control with Tylenol (acetaminophen) and Motrin (ibuprofen or Advil). Alternating these medications with each other allows you to maximize your pain control. In addition to Tylenol and Motrin, you can use heating pads or ice packs on your incisions to help reduce your pain. ° °How will I alternate your regular strength over-the-counter pain medication? °You will take a dose of pain medication every three hours. °; Start by taking 650 mg of Tylenol (2 pills of 325 mg) °; 3 hours later take 600 mg of Motrin (3 pills of 200 mg) °; 3 hours after taking the Motrin take 650 mg of Tylenol °; 3 hours after that take 600 mg of Motrin. ° ° °- 1 - ° °See example - if your first dose of Tylenol is at 12:00 PM ° ° °12:00 PM Tylenol 650 mg (2 pills of 325 mg)  °3:00 PM Motrin 600 mg (3 pills of 200 mg)  °6:00 PM Tylenol 650 mg (2 pills of 325 mg)  °9:00 PM Motrin 600 mg (3 pills of 200 mg)  °Continue alternating every 3 hours  ° °We recommend that you follow this schedule around-the-clock for at least 3 days after surgery, or until you feel that it is no longer needed. Use the table on the last page of this handout to  keep track of the medications you are taking. °Important: °Do not take more than 3000mg of Tylenol or 3200mg of Motrin in a 24-hour period. °Do not take ibuprofen/Motrin if you have a history of bleeding stomach ulcers, severe kidney disease, &/or actively taking a blood thinner ° °What if I still have pain? °If you have pain that is not controlled with the over-the-counter pain medications (Tylenol and Motrin or Advil) you might have what we call “breakthrough” pain. You will receive a prescription for a small amount of an opioid pain medication such as Oxycodone, Tramadol, or Tylenol with Codeine. Use these opioid pills in the first 24 hours after surgery if you have breakthrough pain. Do not take more than 1 pill every 4-6 hours. ° °If you still have uncontrolled pain after using all opioid pills, don't hesitate to call our staff using the number provided. We will help make sure you are managing your pain in the best way possible, and if necessary, we can provide a prescription for additional pain medication. ° ° °Day 1   ° °Time  °Name of Medication Number of pills taken  °Amount of Acetaminophen  °Pain Level  ° °Comments  °AM PM       °AM PM       °  AM PM       °AM PM       °AM PM       °AM PM       °AM PM       °AM PM       °Total Daily amount of Acetaminophen °Do not take more than  3,000 mg per day    ° ° °Day 2   ° °Time  °Name of Medication Number of pills °taken  °Amount of Acetaminophen  °Pain Level  ° °Comments  °AM PM       °AM PM       °AM PM       °AM PM       °AM PM       °AM PM       °AM PM       °AM PM       °Total Daily amount of Acetaminophen °Do not take more than  3,000 mg per day    ° ° °Day 3   ° °Time  °Name of Medication Number of pills taken  °Amount of Acetaminophen  °Pain Level  ° °Comments  °AM PM       °AM PM       °AM PM       °AM PM       ° ° ° °AM PM       °AM PM       °AM PM       °AM PM       °Total Daily amount of Acetaminophen °Do not take more than  3,000 mg per day    ° ° °Day  4   ° °Time  °Name of Medication Number of pills taken  °Amount of Acetaminophen  °Pain Level  ° °Comments  °AM PM       °AM PM       °AM PM       °AM PM       °AM PM       °AM PM       °AM PM       °AM PM       °Total Daily amount of Acetaminophen °Do not take more than  3,000 mg per day    ° ° °Day 5   ° °Time  °Name of Medication Number °of pills taken  °Amount of Acetaminophen  °Pain Level  ° °Comments  °AM PM       °AM PM       °AM PM       °AM PM       °AM PM       °AM PM       °AM PM       °AM PM       °Total Daily amount of Acetaminophen °Do not take more than  3,000 mg per day    ° ° ° °Day 6   ° °Time  °Name of Medication Number of pills °taken  °Amount of Acetaminophen  °Pain Level  °Comments  °AM PM       °AM PM       °AM PM       °AM PM       °AM PM       °AM PM       °AM PM       °AM PM       °Total Daily amount   of Acetaminophen °Do not take more than  3,000 mg per day    ° ° °Day 7   ° °Time  °Name of Medication Number of pills taken  °Amount of Acetaminophen  °Pain Level  ° °Comments  °AM PM       °AM PM       °AM PM       °AM PM       °AM PM       °AM PM       °AM PM       °AM PM       °Total Daily amount of Acetaminophen °Do not take more than  3,000 mg per day    ° ° ° ° °For additional information about how and where to safely dispose of unused opioid °medications - https://www.[employer]powerful[employer].org ° °Disclaimer: This document contains information and/or instructional materials adapted from [facility] for the typical patient with your condition. It does not replace medical advice from your health care provider because your experience may differ from that of the °typical patient. Talk to your health care provider if you have any questions about this °document, your condition or your treatment plan. °Adapted from [facility] ° ° °

## 2019-01-29 ENCOUNTER — Encounter (HOSPITAL_COMMUNITY): Payer: Self-pay | Admitting: General Surgery

## 2019-01-29 NOTE — Anesthesia Postprocedure Evaluation (Signed)
Anesthesia Post Note  Patient: Ardel Sloan  Procedure(s) Performed: LAPAROSCOPIC CHOLECYSTECTOMY WITH ATTEMPTED INTRAOPERATIVE CHOLANGIOGRAM (N/A Abdomen)     Patient location during evaluation: PACU Anesthesia Type: General Level of consciousness: awake and alert Pain management: pain level controlled Vital Signs Assessment: post-procedure vital signs reviewed and stable Respiratory status: spontaneous breathing, nonlabored ventilation, respiratory function stable and patient connected to nasal cannula oxygen Cardiovascular status: blood pressure returned to baseline and stable Postop Assessment: no apparent nausea or vomiting Anesthetic complications: no    Last Vitals:  Vitals:   01/28/19 1255 01/28/19 1308  BP: 134/82 132/80  Pulse: (!) 59 61  Resp: 12 12  Temp: 36.6 C   SpO2: 94% 95%    Last Pain:  Vitals:   01/28/19 1308  TempSrc:   PainSc: 2                  Adrienna Karis L Joelyn Lover

## 2020-12-10 IMAGING — US ULTRASOUND ABDOMEN LIMITED
1 series · 14 of 25 positions shown · non-contrast
Comparison: None.

CLINICAL DATA: Right upper quadrant abdominal pain

EXAM:
ULTRASOUND ABDOMEN LIMITED RIGHT UPPER QUADRANT

[Series 1: ultrasound abdomen limited · 14 of 57 slices shown]
[im 1/57]
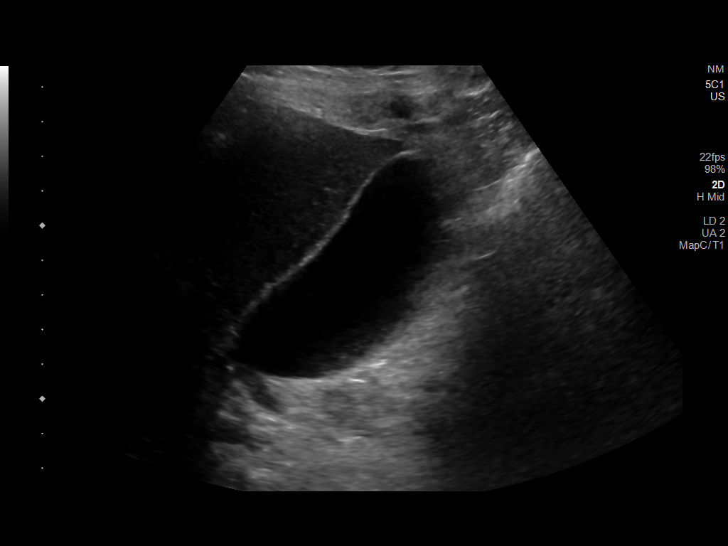
[im 5/57]
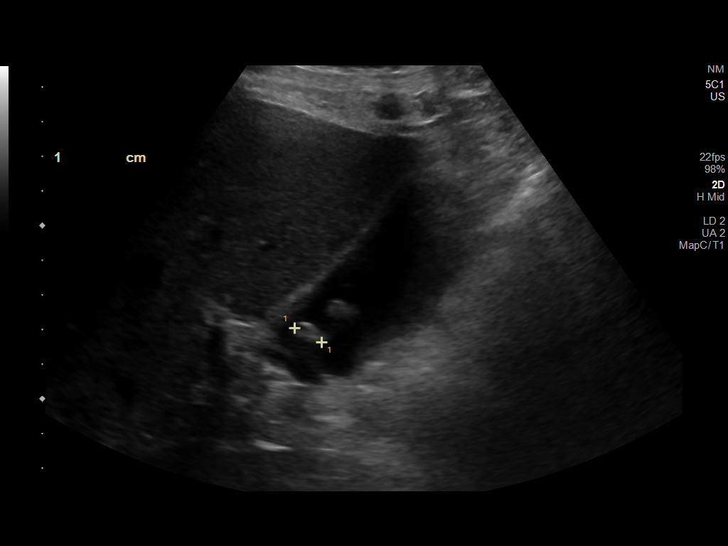
[im 10/57]
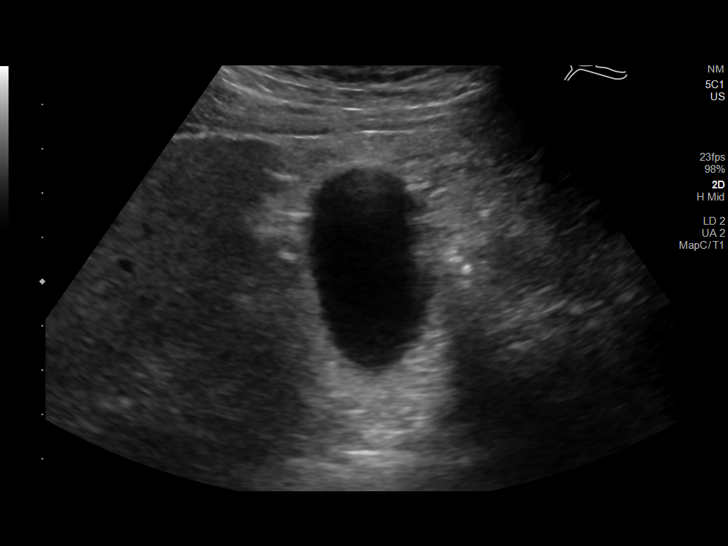
[im 15/57]
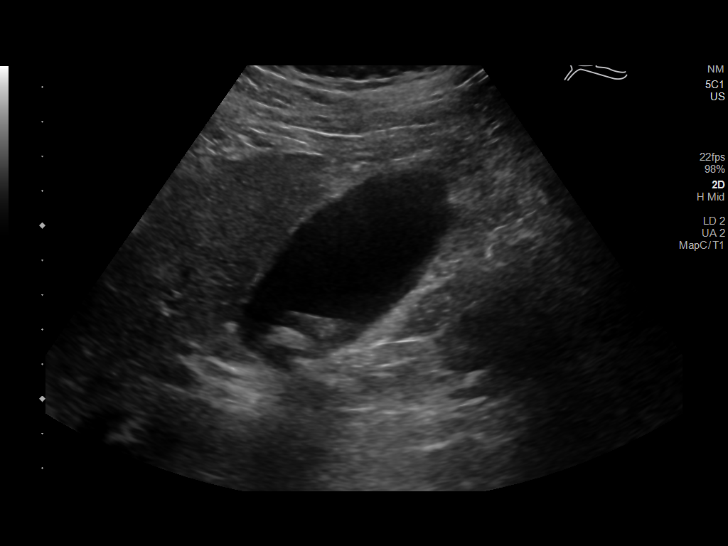
[im 19/57]
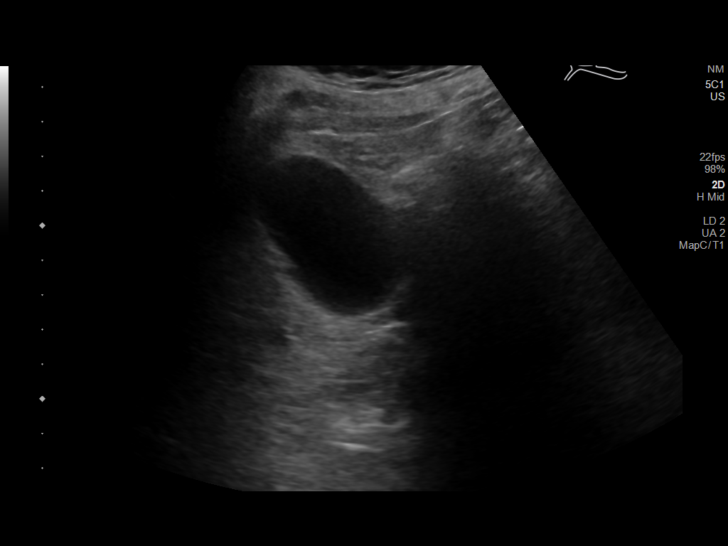
[im 22/57]
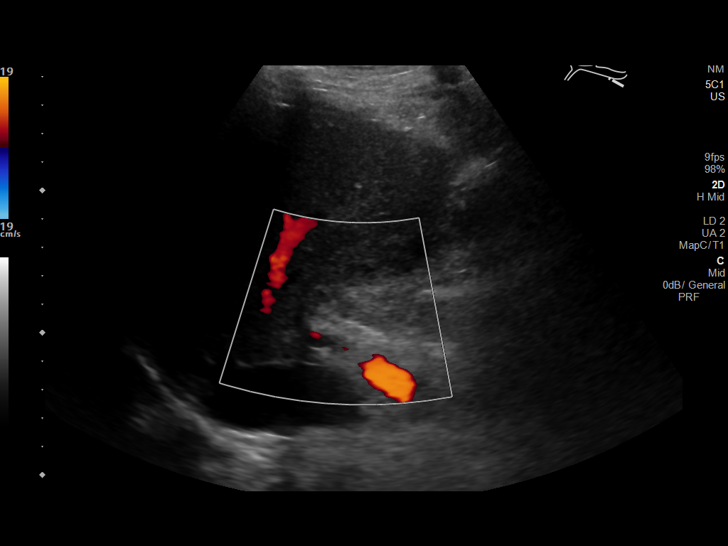
[im 26/57]
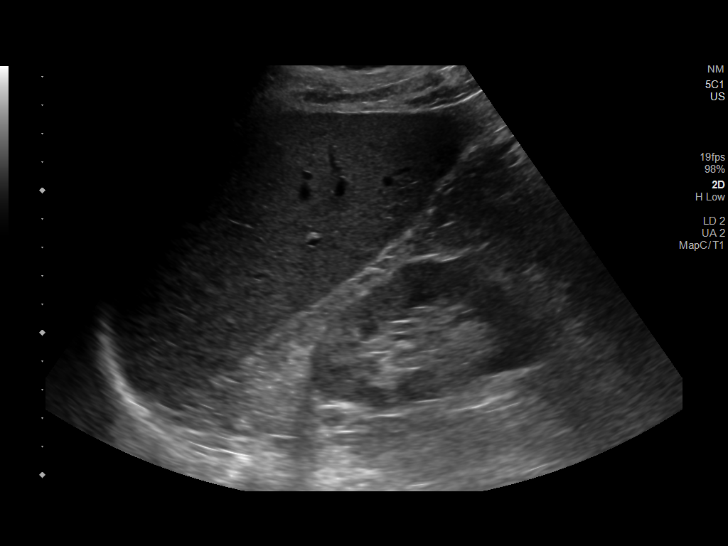
[im 31/57]
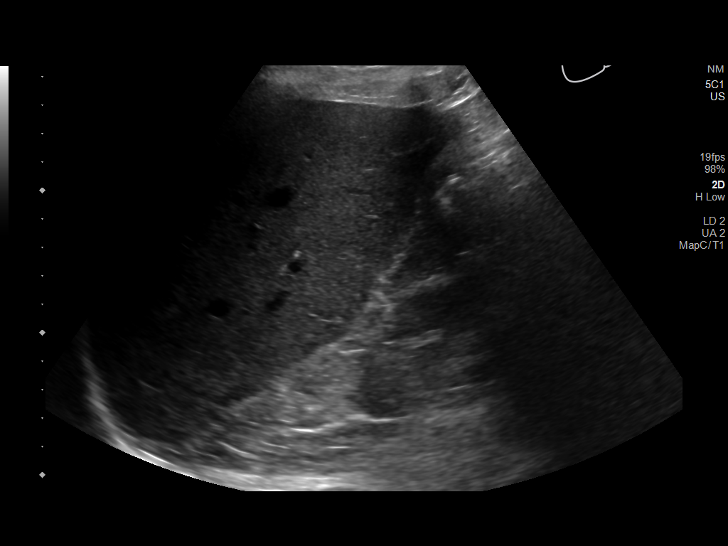
[im 36/57]
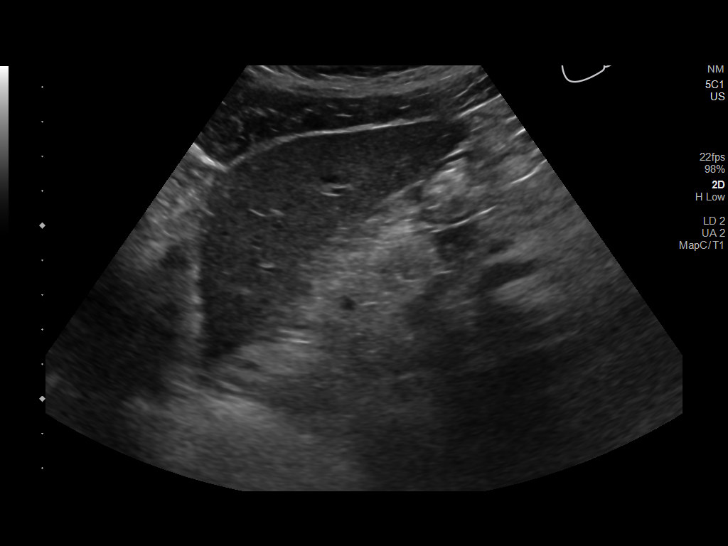
[im 38/57]
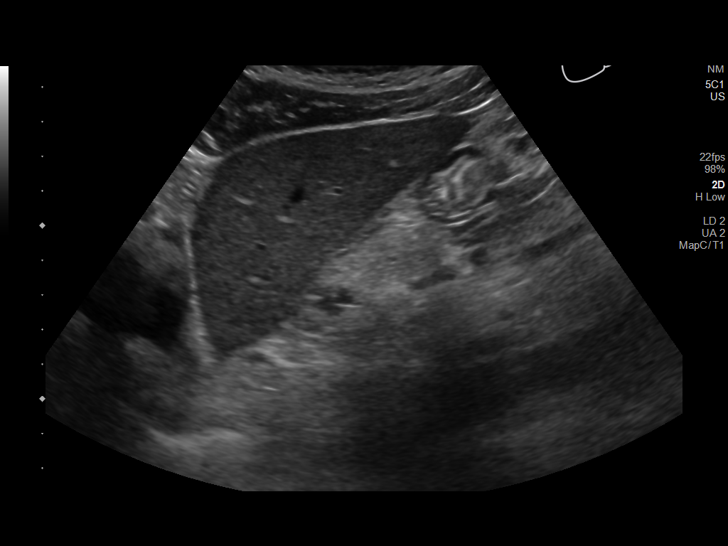
[im 43/57]
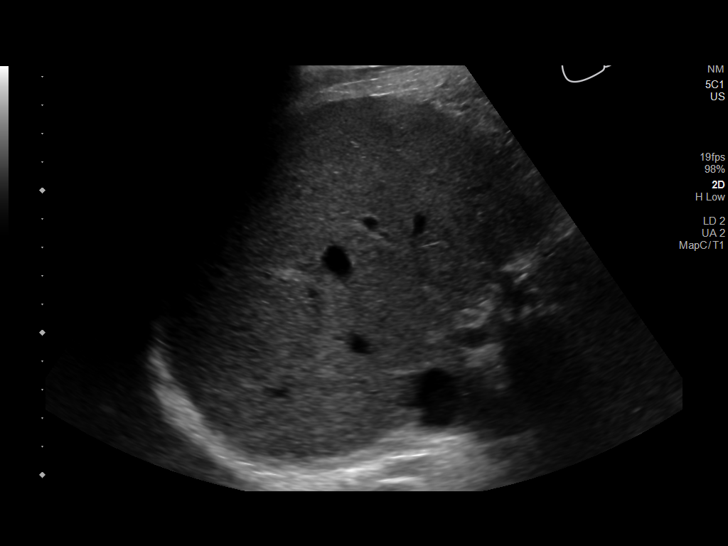
[im 47/57]
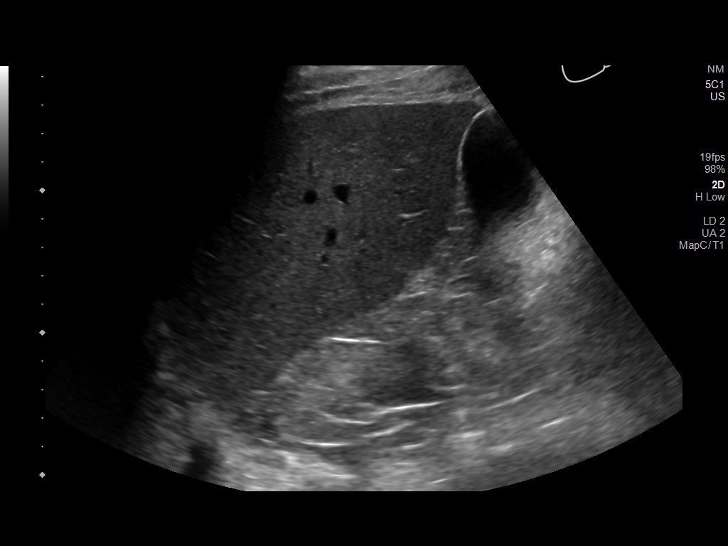
[im 52/57]
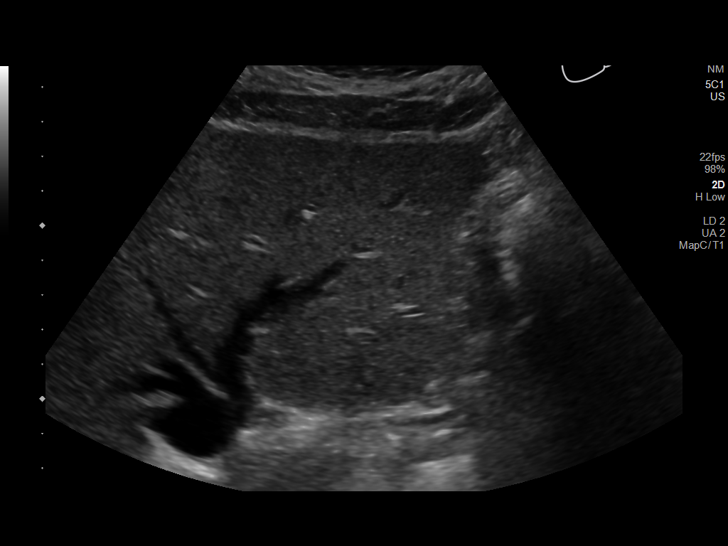
[im 57/57]
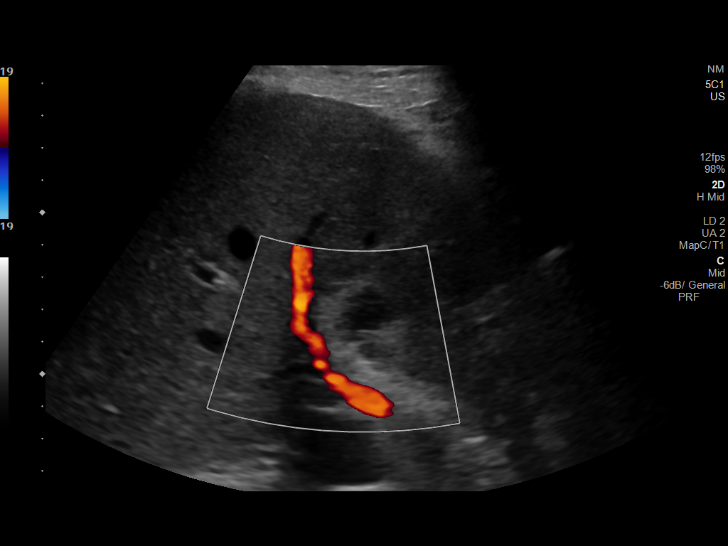

[14 of 25 positions shown; findings below may reference images not displayed]

FINDINGS: Gallbladder:

Multiple gallstones are noted without gallbladder wall thickening or
pericholecystic free fluid. The sonographic Murphy sign is reported
as negative. The gallbladder is moderately distended.

Common bile duct:

Diameter: 0.3 cm

Liver:

No focal lesion identified. Within normal limits in parenchymal
echogenicity. Portal vein is patent on color Doppler imaging with
normal direction of blood flow towards the liver.
IMPRESSION: Cholelithiasis without sonographic evidence for acute cholecystitis.
The gallbladder is somewhat distended.
